# Patient Record
Sex: Male | Born: 1980 | Race: White | Hispanic: No | Marital: Married | Smoking: Never smoker
Health system: Southern US, Academic
[De-identification: ages and names within clinical notes are randomized; demographics above are authoritative.]

## PROBLEM LIST (undated history)

## (undated) DIAGNOSIS — R519 Headache, unspecified: Secondary | ICD-10-CM

## (undated) DIAGNOSIS — I10 Essential (primary) hypertension: Secondary | ICD-10-CM

## (undated) DIAGNOSIS — E785 Hyperlipidemia, unspecified: Secondary | ICD-10-CM

## (undated) HISTORY — DX: Headache, unspecified: R51.9

## (undated) HISTORY — DX: Essential (primary) hypertension: I10

## (undated) HISTORY — DX: Hyperlipidemia, unspecified: E78.5

---

## 2010-10-15 ENCOUNTER — Encounter (INDEPENDENT_AMBULATORY_CARE_PROVIDER_SITE_OTHER): Payer: BC Managed Care – PPO

## 2010-10-15 ENCOUNTER — Ambulatory Visit (INDEPENDENT_AMBULATORY_CARE_PROVIDER_SITE_OTHER): Payer: Self-pay

## 2010-10-29 ENCOUNTER — Ambulatory Visit (INDEPENDENT_AMBULATORY_CARE_PROVIDER_SITE_OTHER): Payer: Self-pay

## 2011-02-09 ENCOUNTER — Telehealth (INDEPENDENT_AMBULATORY_CARE_PROVIDER_SITE_OTHER): Payer: Self-pay

## 2011-02-09 NOTE — Telephone Encounter (Deleted)
Beth with [facility] wanted [place] to know that we referred pt over for eval. Appoint was scheduled for 02/05/2011. Patient was a No show.

## 2011-04-15 ENCOUNTER — Encounter (INDEPENDENT_AMBULATORY_CARE_PROVIDER_SITE_OTHER): Payer: BC Managed Care – PPO

## 2020-02-19 ENCOUNTER — Other Ambulatory Visit (HOSPITAL_COMMUNITY): Payer: Self-pay

## 2020-02-19 LAB — EXTERNAL COVID-19 MOLECULAR RESULT: External 2019-n-CoV/SARS-CoV-2: POSITIVE — AB

## 2020-02-23 ENCOUNTER — Emergency Department (HOSPITAL_COMMUNITY): Payer: BC Managed Care – PPO

## 2020-02-23 ENCOUNTER — Encounter (HOSPITAL_COMMUNITY): Payer: Self-pay

## 2020-02-23 ENCOUNTER — Other Ambulatory Visit: Payer: Self-pay

## 2020-02-23 ENCOUNTER — Emergency Department
Admission: EM | Admit: 2020-02-23 | Discharge: 2020-02-23 | Disposition: A | Discharge status: Home / Self Care | Payer: BC Managed Care – PPO | Attending: Physician Assistant | Admitting: Physician Assistant

## 2020-02-23 DIAGNOSIS — U071 COVID-19: Secondary | ICD-10-CM | POA: Insufficient documentation

## 2020-02-23 DIAGNOSIS — R0981 Nasal congestion: Secondary | ICD-10-CM | POA: Insufficient documentation

## 2020-02-23 DIAGNOSIS — R05 Cough: Secondary | ICD-10-CM | POA: Insufficient documentation

## 2020-02-23 DIAGNOSIS — R0602 Shortness of breath: Secondary | ICD-10-CM | POA: Insufficient documentation

## 2020-02-23 DIAGNOSIS — J3489 Other specified disorders of nose and nasal sinuses: Secondary | ICD-10-CM | POA: Insufficient documentation

## 2020-02-23 DIAGNOSIS — R509 Fever, unspecified: Secondary | ICD-10-CM | POA: Insufficient documentation

## 2020-02-23 MED ORDER — ALBUTEROL SULFATE HFA 90 MCG/ACTUATION AEROSOL INHALER
1.0000 | INHALATION_SPRAY | Freq: Four times a day (QID) | RESPIRATORY_TRACT | 0 refills | Status: AC | PRN
Start: 2020-02-23 — End: ?

## 2020-02-23 MED ORDER — DOXYCYCLINE HYCLATE 100 MG TABLET
100.0000 mg | ORAL_TABLET | Freq: Two times a day (BID) | ORAL | 0 refills | Status: AC
Start: 2020-02-23 — End: 2020-03-01

## 2020-02-23 MED ORDER — METHYLPREDNISOLONE 4 MG TABLETS IN A DOSE PACK
ORAL_TABLET | ORAL | 0 refills | Status: AC
Start: 2020-02-23 — End: ?

## 2020-02-23 NOTE — ED Triage Notes (Addendum)
Pt states he was taking a nap on the couch. He woke up hot and was unable to catch breath, unable to take a deep breath. Pt states his nose is really stuffed up, doesn't know if he should take medicine for it. Wife gave him x2 Mucinex @ 1430, feels better. PT states he tested positive for COVID x5days ago.

## 2020-02-23 NOTE — Discharge Instructions (Signed)
Discharge home  2. Return to the emergency department for any fevers chills shortness of breath nausea vomiting change in symptoms or symptoms of concern

## 2020-02-23 NOTE — ED Nurses Note (Signed)
AVS PROVIDED, HOME SELF AMBULATORY.  PT STATES UNDERSTANDING OF FOLLOW UP AS WELL AS CONCERNING SYMPTOMS

## 2020-02-23 NOTE — ED Provider Notes (Signed)
[facility]  Emergency Department  Provider Note      Trevor Woods  09-07-1980  39 y.o.  male  [place] [address]   [phone] (home)  [place], [place]    Chief Complaint:   Chief Complaint   Patient presents with    Fever    Shortness of Breath       HPI: This is a 39 y.o. male who presents to the emergency department complaining of shortness of breath.  Patient is unvaccinated.  He was diagnosed with COVID 5 days ago.  He says he has had some cough chills some runny nose and some sinus congestion.  He has been using some nasal spray.  Today will he woke from a nap and was very short of breath so he presented to the emergency department.  He has no other complaints.      Past Medical History:   Past Medical History:   Diagnosis Date    Headache     HTN (hypertension)     Hyperlipidemia        Past Surgical History: History reviewed. No pertinent surgical history.    Social History:   Social History     Tobacco Use    Smoking status: Never Smoker    Smokeless tobacco: Never Used   Substance Use Topics    Alcohol use: Not Currently    Drug use: Never      Social History     Substance and Sexual Activity   Drug Use Never       Allergies:   Allergies   Allergen Reactions    Ceclor [Cefaclor] Hives/ Urticaria       Medications: (Not in an outpatient encounter)         Review of Systems   Constitutional: Negative for chills, fever and malaise/fatigue.        Review of systems as below.  Additional systems reviewed in HPI.     HENT: Negative for congestion, sinus pain, sore throat and tinnitus.    Eyes: Negative for blurred vision, photophobia, pain and redness.   Respiratory: Negative for cough, hemoptysis, shortness of breath and wheezing.    Cardiovascular: Negative for chest pain, palpitations, orthopnea, leg swelling and PND.   Gastrointestinal: Negative for abdominal pain, blood in stool, diarrhea, heartburn, nausea and vomiting.   Genitourinary: Negative for dysuria,  frequency, hematuria and urgency.   Musculoskeletal: Negative for back pain, joint pain, myalgias and neck pain.   Skin: Negative for rash.   Neurological: Negative for dizziness, sensory change, speech change, focal weakness, weakness and headaches.   Endo/Heme/Allergies: Negative for environmental allergies. Does not bruise/bleed easily.   Psychiatric/Behavioral: Negative for depression, hallucinations, memory loss, substance abuse and suicidal ideas.         ED Triage Vitals [02/23/20 1551]   BP (Non-Invasive) (!) 152/83   Heart Rate 86   Respiratory Rate 18   Temperature 35.7 C (96.3 F)   SpO2 98 %   Weight 95.3 kg (210 lb)   Height 1.778 m (5\' 10" )       Physical Exam  Constitutional:       Comments: Pleasant male who does not appear ill sitting up in bed.  No distress.   HENT:      Head: Normocephalic and atraumatic.      Right Ear: External ear normal.      Left Ear: External ear normal.      Nose: Nose normal.  Eyes:      Pupils: Pupils are equal, round, and reactive to light.   Cardiovascular:      Rate and Rhythm: Normal rate and regular rhythm.      Heart sounds: Normal heart sounds.   Pulmonary:      Breath sounds: Normal breath sounds.      Comments: No wheezes rales or rhonchi.  Abdominal:      General: Bowel sounds are normal. There is no distension.      Palpations: Abdomen is soft.      Tenderness: There is no abdominal tenderness.   Musculoskeletal:         General: No tenderness or deformity. Normal range of motion.      Cervical back: Normal range of motion and neck supple.   Skin:     General: Skin is warm and dry.      Findings: No rash.   Neurological:      Mental Status: He is alert and oriented to person, place, and time.      Cranial Nerves: No cranial nerve deficit.      Deep Tendon Reflexes: Reflexes are normal and symmetric.   Psychiatric:         Mood and Affect: Affect normal.         Cognition and Memory: Memory normal.         Judgment: Judgment normal.           Labs:   No  results found for this or any previous visit (from the past 12 hour(s)).    I have reviewed all labs ordered.  See course.    Imaging:  No orders to display       I have seen and reviewed all radiology images ordered. See course.    ED Course/ MDM/ Plan:   Patient was triaged, vital signs were obtained, patient was  placed in a room.  On exam, patient is alert and oriented, nontoxic on exam, and in no acute distress. Vitals were reviewed. Work-up ordered.      ED Course as of Feb 22 1650   Sat Feb 23, 2020   1649 No acute process   XR AP MOBILE CHEST [JB]      ED Course User Index  [JB] Trevor Hoes, PA-C       Medications - No data to display     Procedures  None    Clinical Impression:   Encounter Diagnosis   Name Primary?    COVID-19 Yes           Disposition: Discharged  New Prescriptions    ALBUTEROL SULFATE (PROVENTIL HFA) 90 MCG/ACTUATION INHALATION HFA AEROSOL INHALER    Take 1-2 Puffs by inhalation Every 6 hours as needed    DOXYCYCLINE 100 MG ORAL TABLET    Take 1 Tablet (100 mg total) by mouth Twice daily for 7 days    METHYLPREDNISOLONE (MEDROL DOSEPACK) 4 MG ORAL TABLETS, DOSE PACK    Take as instructed.      Trevor Mose, PA-C  [address]  [address]  [place] [address]  [phone]    In 3 days       BP (!) 152/83    Pulse 86    Temp 35.7 C (96.3 F)    Resp 18    Ht 1.778 m (5\' 10" )    Wt 95.3 kg (210 lb)    SpO2 98%    BMI 30.13 kg/m  Trevor Hopes, PA-C       This note was partially created using voice recognition software and is inherently subject to errors including those of syntax and "sound alike " substitutions which may escape proof reading.  In such instances, original meaning may be extrapolated by contextual derivation.

## 2023-05-02 IMAGING — MR MRI THORACIC SPINE WITHOUT CONTRAST
4 of 5 series · 26 of 48 positions shown · non-contrast
Comparison: None previous.

﻿EXAM:  [id number]   MRI THORACIC SPINE WITHOUT CONTRAST
INDICATION: Trauma approximately one year ago.  Neck pain and upper back pain.  No history of malignancy or back surgery.
TECHNIQUE: Coronal, sagittal and axial images as per protocol.

[Series 5: T2 · sagittal · 4.5mm · 0.78mm/px · 8 of 13 slices shown (1 of 2)]
[im 1/13]
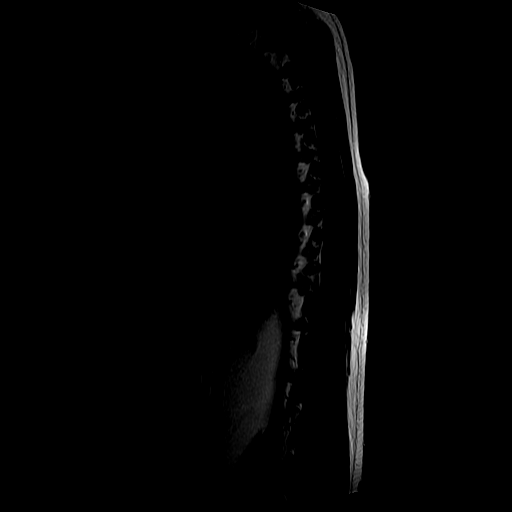
[im 2/13]
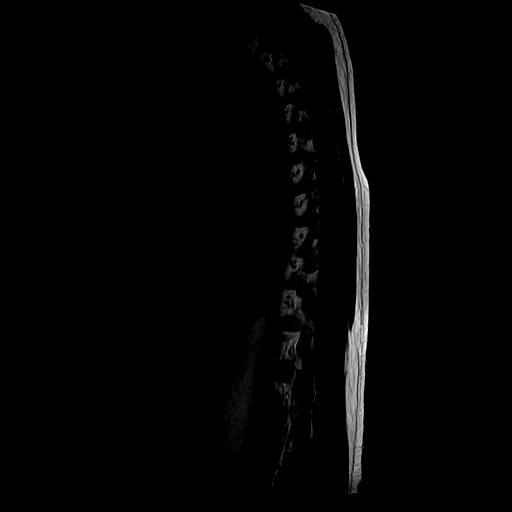
[im 5/13]
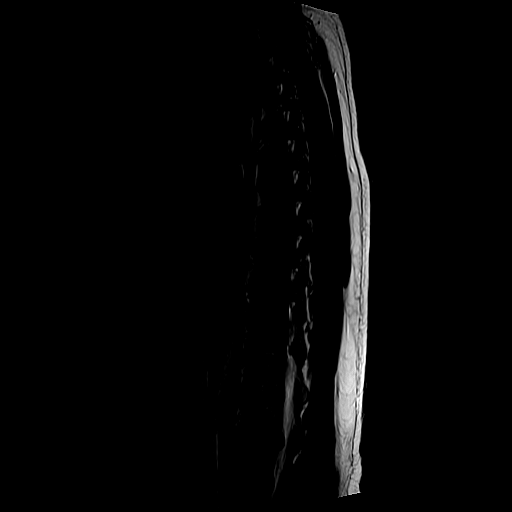
[im 6/13]
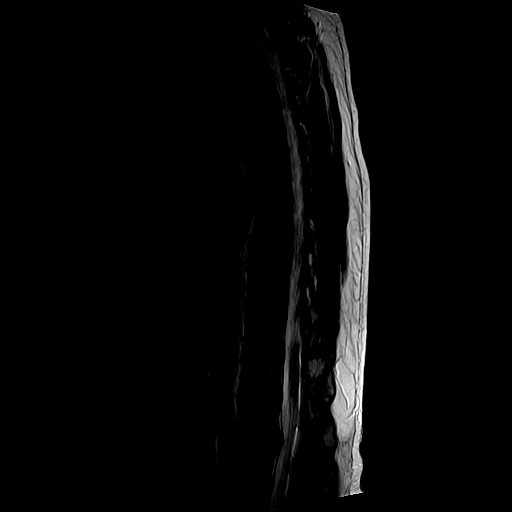
[im 7/13]
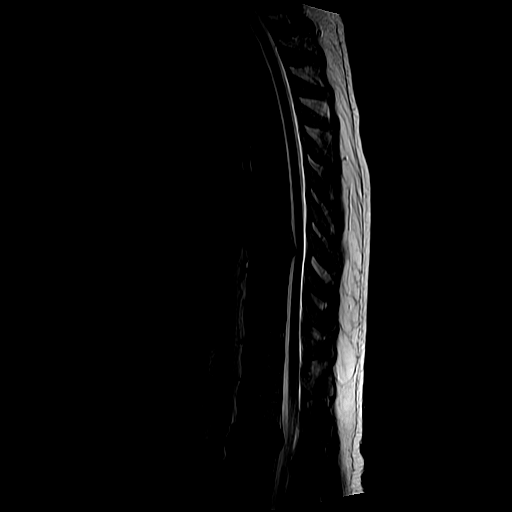
[im 9/13]
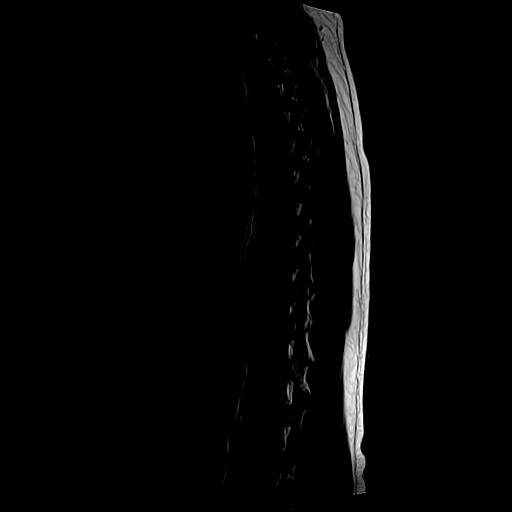
[im 11/13]
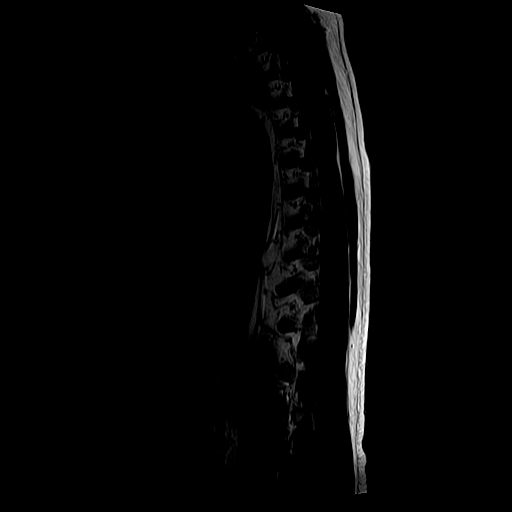
[im 13/13]
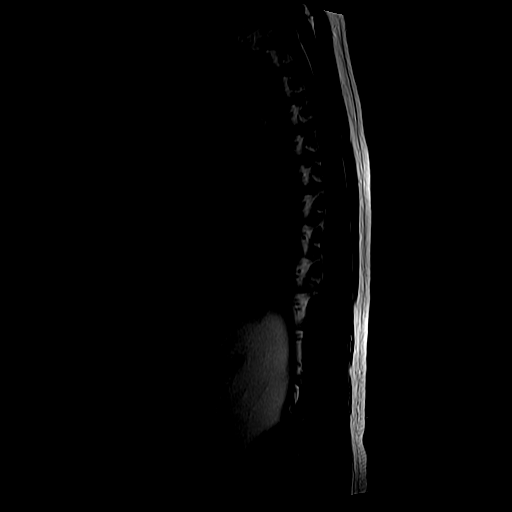

[Series 6: T1 · sagittal · 4.5mm · 0.78mm/px · 6 of 13 slices shown (1 of 2)]
[im 1/13]
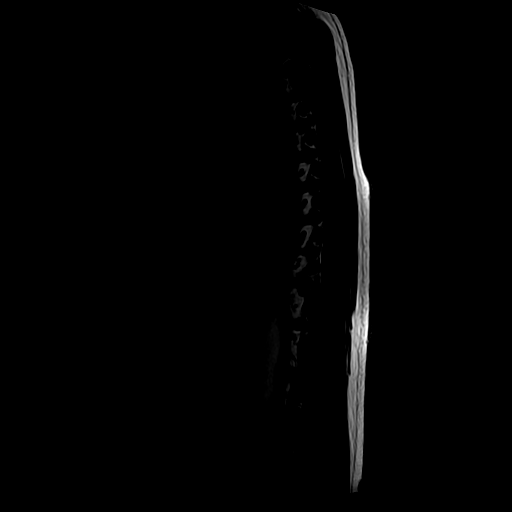
[im 2/13]
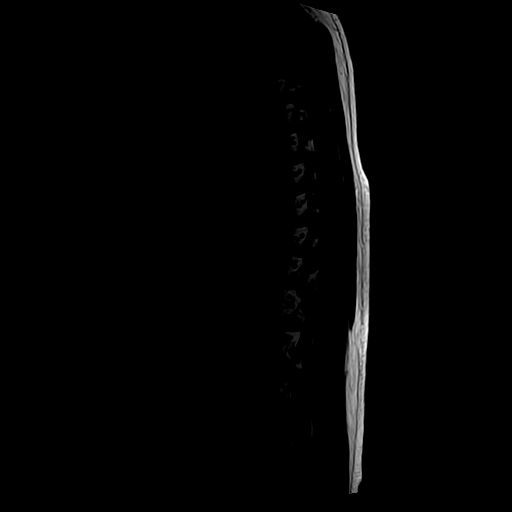
[im 5/13]
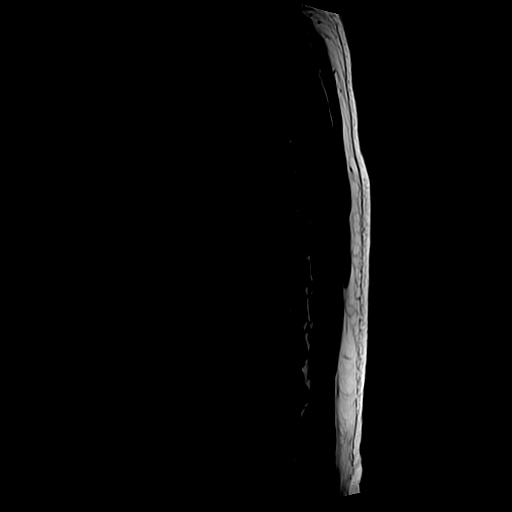
[im 6/13]
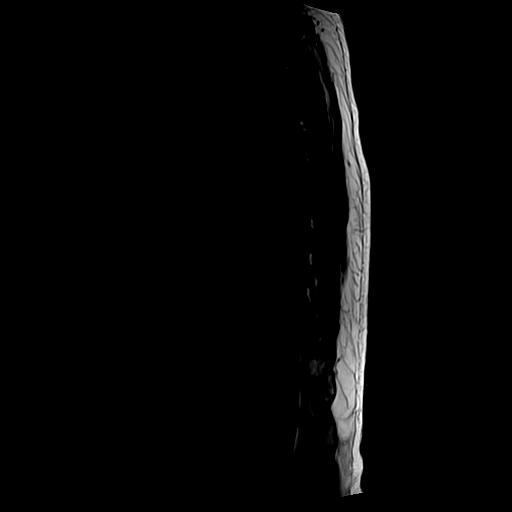
[im 7/13]
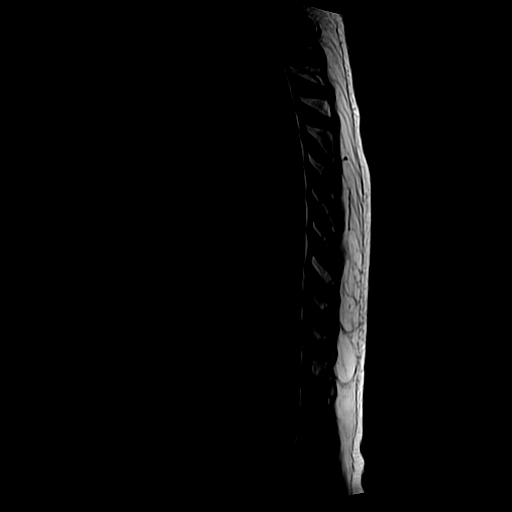
[im 11/13]
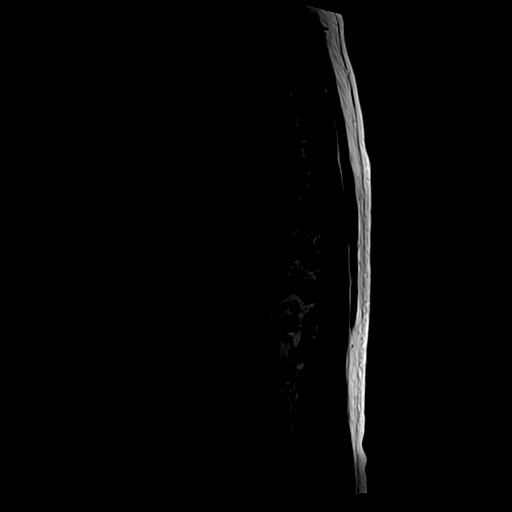

[Series 8: T2 · axial · 4.0mm · 0.62mm/px · z∈[-300,-68]mm · 9 of 12 slices shown (2 of 2)]
[im 1/12]
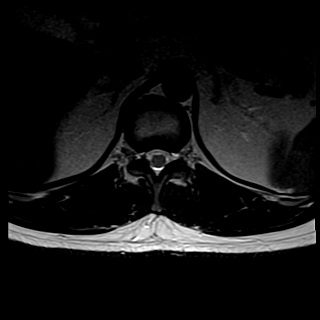
[im 2/12]
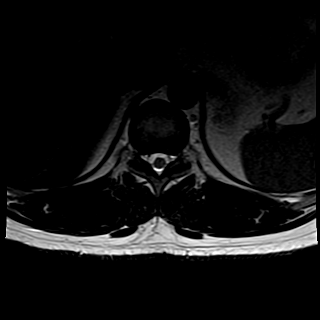
[im 3/12]
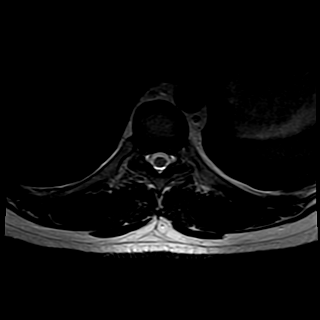
[im 5/12]
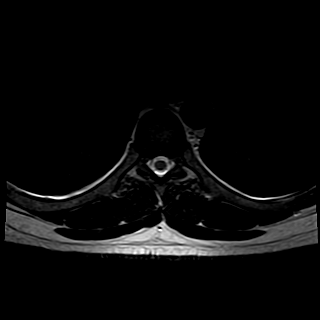
[im 6/12]
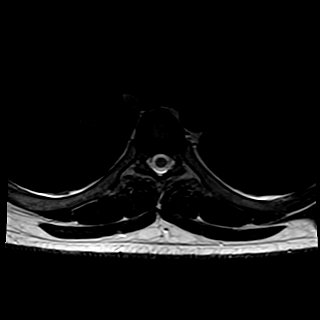
[im 7/12]
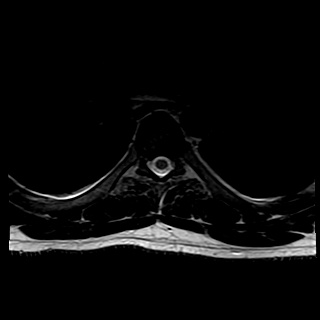
[im 9/12]
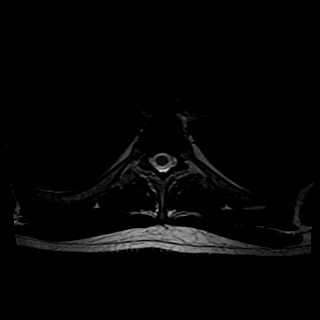
[im 10/12]
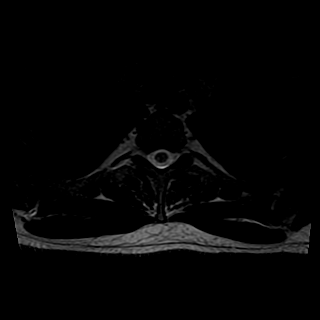
[im 12/12]
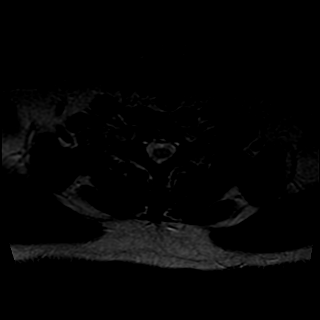

[Series 9: T1 · axial · 4.0mm · 0.62mm/px · z∈[-269,-96]mm · 3 of 12 slices shown (2 of 2)]
[im 2/12]
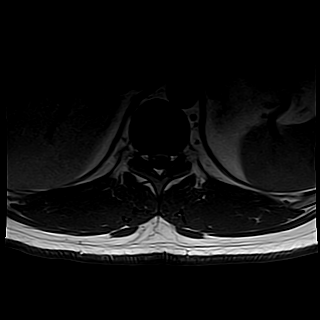
[im 6/12]
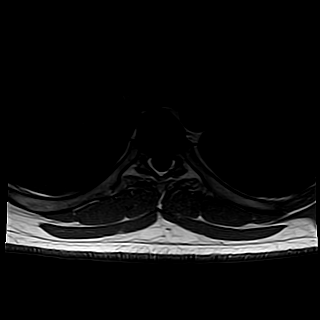
[im 10/12]
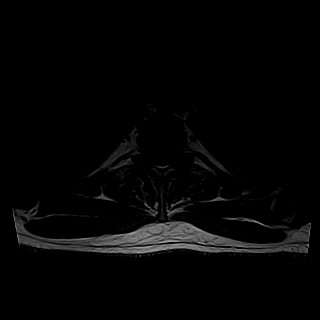

[26 of 48 positions shown; findings below may reference images not displayed]

FINDINGS: No acute bony lesions of thoracic vertebral bodies are noted. 

At T8-T9 level, degenerative disc disease with left paracentral disc protrusion is noted moderately impinging on thecal sac to the left of the midline at this level and minimally impinging on the thoracic spinal cord.  

Degenerative disc changes at L1-L2 level are noted with bulging annulus causing moderate biforaminal narrowing.  

Remaining thoracic discs are relatively normal.  Thoracic spinal cord shows no focal abnormalities.  Paravertebral soft tissues are unremarkable.
IMPRESSION: 1. No acute bony lesions of thoracic vertebrae. 

2. At T8-T9 level, degenerative disc disease with left paracentral disc protrusion is noted moderately impinging on thecal sac to the left of the midline at this level and minimally impinging on the thoracic spinal cord.  

3. Degenerative disc changes at L1-L2 level are noted with bulging annulus causing moderate biforaminal narrowing.  

4. Thoracic spinal cord shows no focal abnormalities.  Paravertebral soft tissues are unremarkable.

## 2023-05-02 IMAGING — MR MRI CERVICAL SPINE WITHOUT CONTRAST
4 of 5 series · 23 of 48 positions shown · non-contrast
Comparison: None previous.

﻿EXAM:  [id number]   MRI CERVICAL SPINE WITHOUT CONTRAST
INDICATION: 42-year-old sustained injury about one year ago.  Neck pain and upper back pain.  No history of back surgery.
TECHNIQUE: Coronal, sagittal and axial images as per protocol.

[Series 5: T2 · sagittal · 4.0mm · 0.75mm/px · 8 of 13 slices shown (1 of 2)]
[im 1/13]
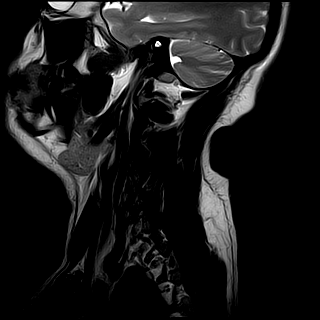
[im 2/13]
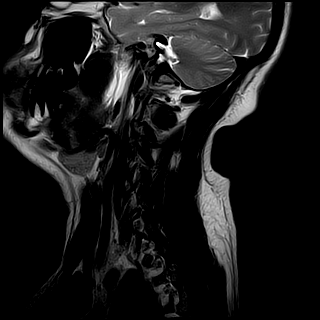
[im 4/13]
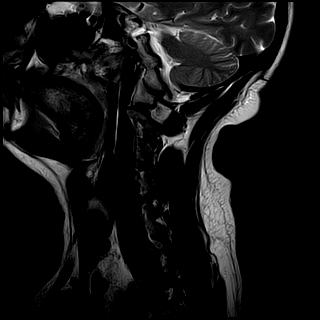
[im 6/13]
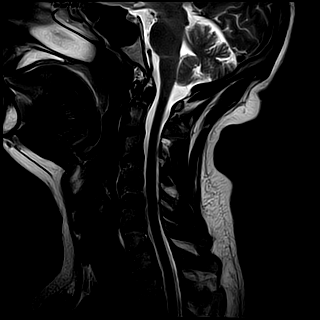
[im 7/13]
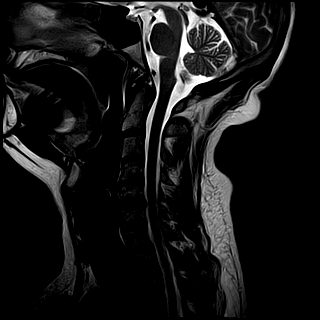
[im 9/13]
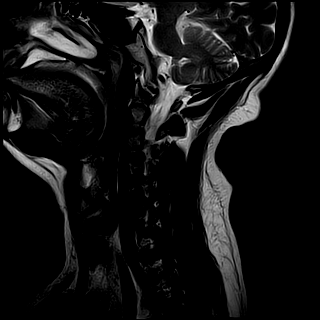
[im 11/13]
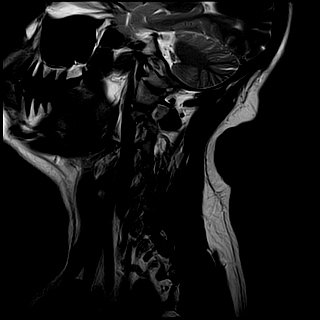
[im 13/13]
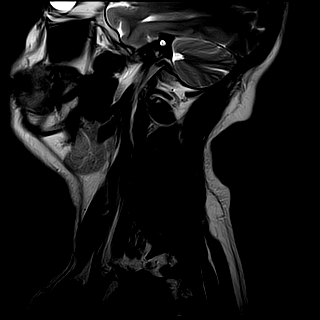

[Series 6: T1 · sagittal · 4.0mm · 0.47mm/px · 3 of 13 slices shown]
[im 2/13]
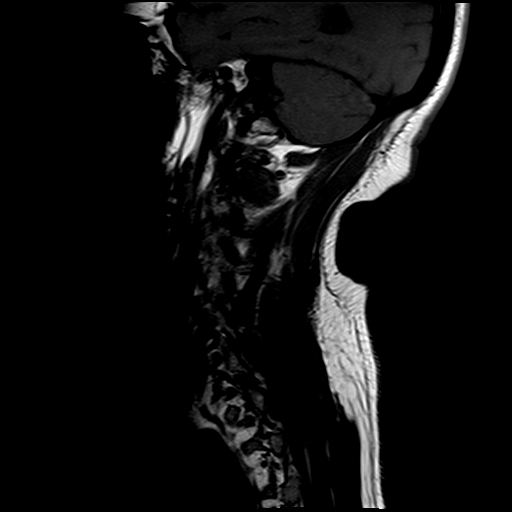
[im 7/13]
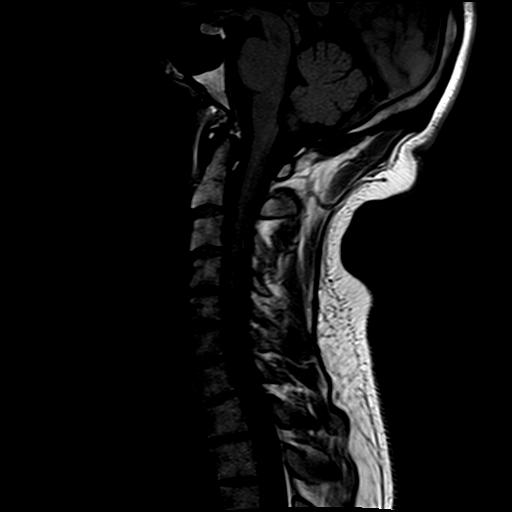
[im 11/13]
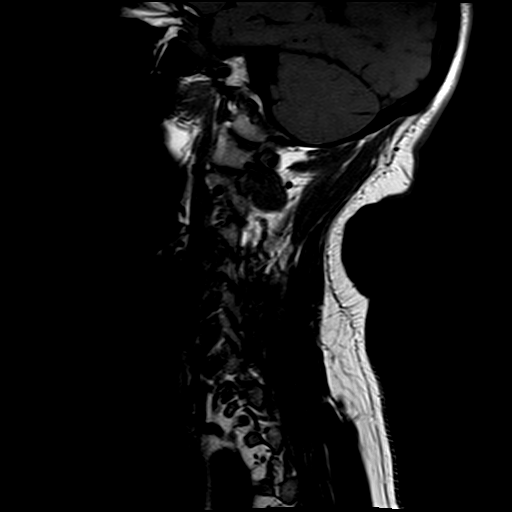

[Series 7: STIR · sagittal · 4.0mm · 0.47mm/px · 3 of 13 slices shown]
[im 2/13]
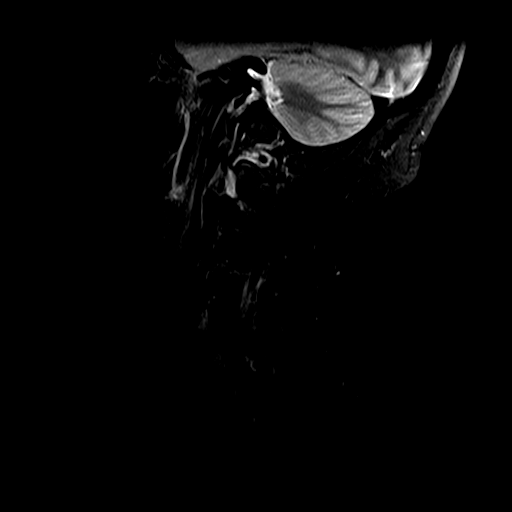
[im 7/13]
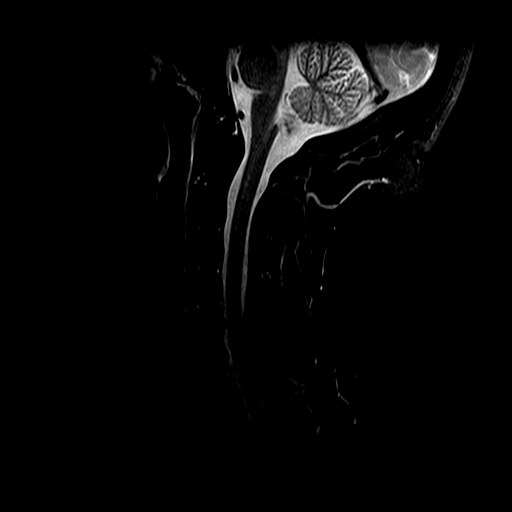
[im 11/13]
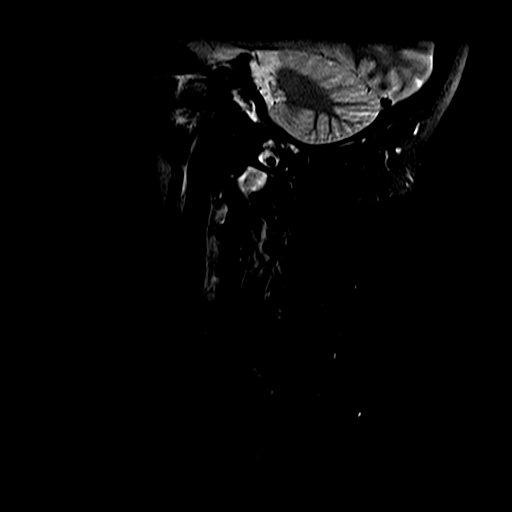

[Series 9: T2 · axial · 3.0mm · 0.39mm/px · z∈[-142,-34]mm · 9 of 18 slices shown (2 of 2)]
[im 1/18]
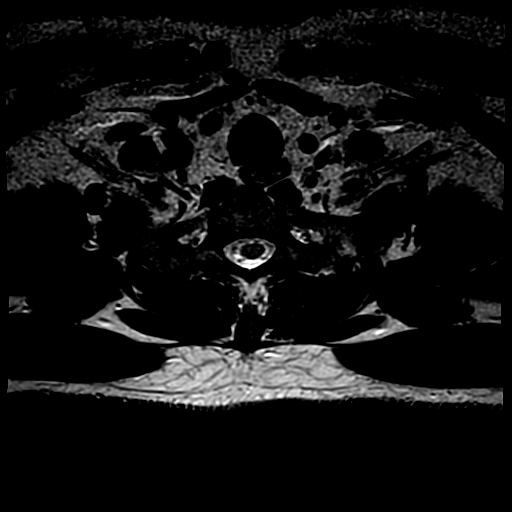
[im 4/18]
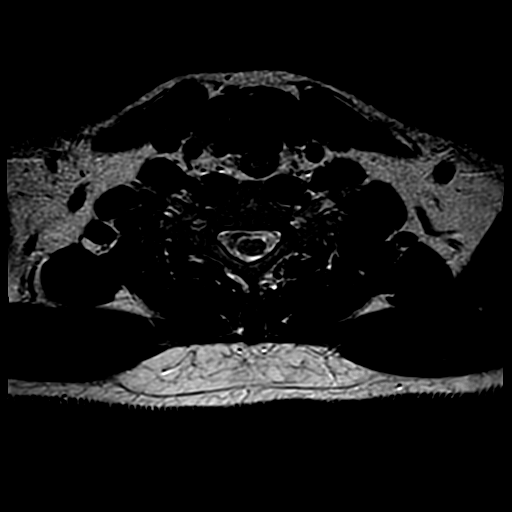
[im 5/18]
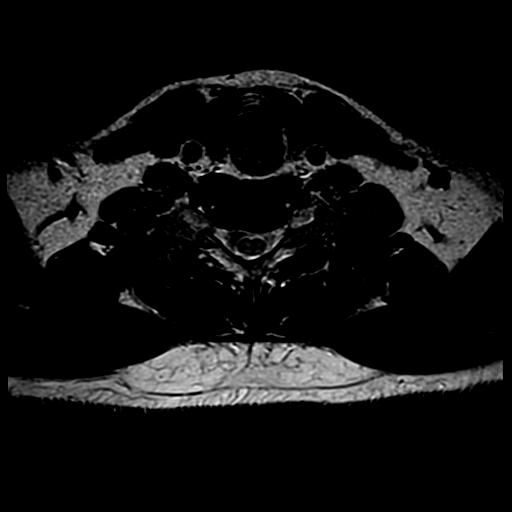
[im 8/18]
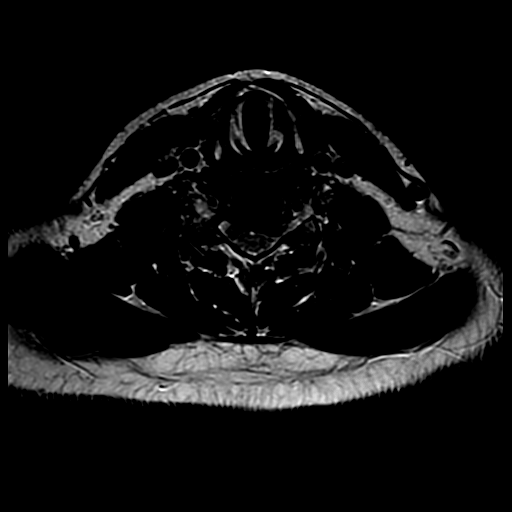
[im 10/18]
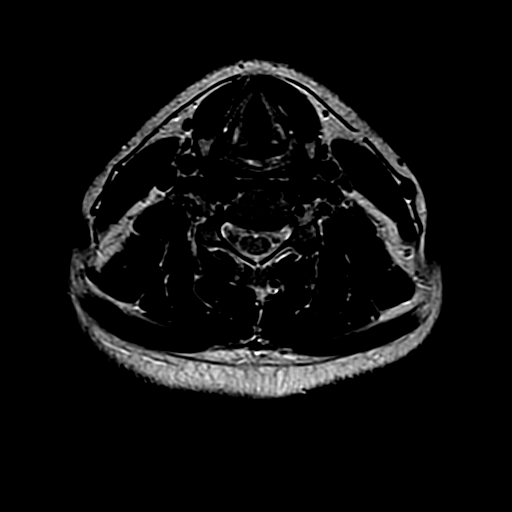
[im 13/18]
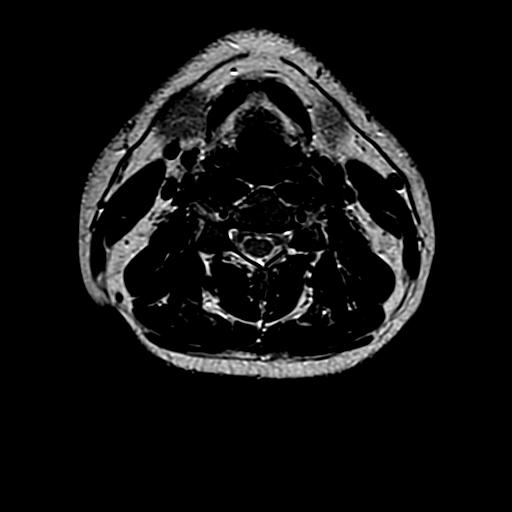
[im 14/18]
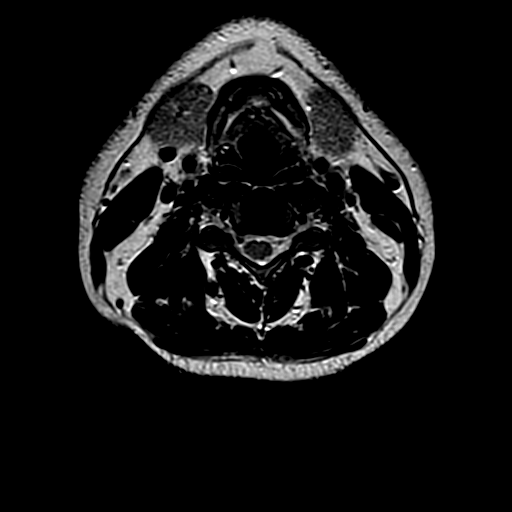
[im 16/18]
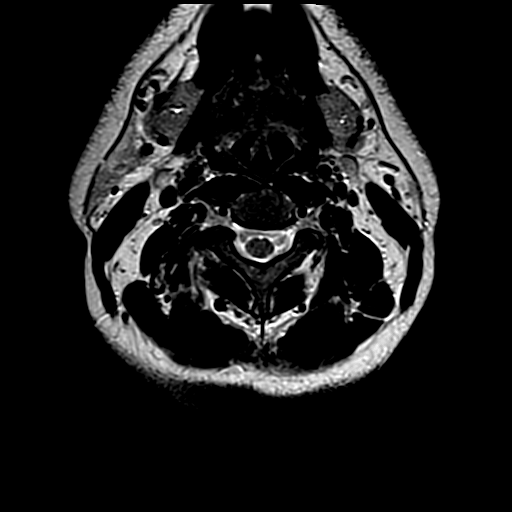
[im 18/18]
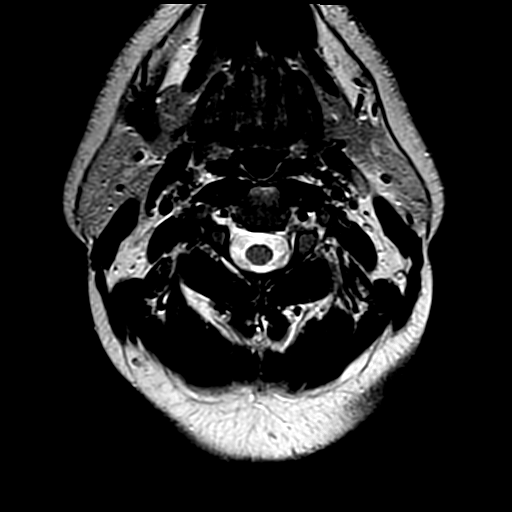

[23 of 48 positions shown; findings below may reference images not displayed]

FINDINGS: No acute or focal bone changes of cervical vertebrae are seen.  Structures at the foramen magnum are normal in the sagittal view. 

C2-C3 disc shows no focal abnormalities.  C3-C4 disc shows no significant focal abnormalities.

At C4-C5 level, degenerative disc disease with asymmetric bulging annulus to the left is causing moderate left foraminal narrowing.  

At C5-C6 level, significant degenerative disc disease with prominent osteophyte complex is noted on the left side causing significant compromise of left lateral recess and the left neural foramen impinging on the exiting nerve root at this level.  Mild compromise of thecal sac is noted at this level due to degenerative changes.  

At C6-C7 level, degenerative disc disease with asymmetric bulging annulus and osteophyte complex to the left are causing moderately significant compromise of left neural foramen.  

C7-T1 disc is normal.  Cervical spinal cord shows no focal abnormalities.
IMPRESSION: 1. No acute bony lesions of cervical vertebrae.

2. At C5-C6 level, significant degenerative disc disease with prominent osteophyte complex is noted on the left side causing significant compromise of left lateral recess and the left neural foramen impinging on the exiting nerve root at this level.  Mild compromise of thecal sac is noted at this level due to degenerative changes.  

3. At C4-C5 level, degenerative disc disease with asymmetric bulging annulus to the left is causing moderate left foraminal narrowing.  

4. Findings at other disc levels are described above in detail.

5. Cervical spinal cord shows no focal abnormalities.  Paravertebral soft tissues are unremarkable.
